# Patient Record
Sex: Female | Born: 1945 | Race: White | Hispanic: No | Smoking: Never smoker
Health system: Southern US, Community
[De-identification: ages and names within clinical notes are randomized; demographics above are authoritative.]

## PROBLEM LIST (undated history)

## (undated) DIAGNOSIS — E7800 Pure hypercholesterolemia, unspecified: Secondary | ICD-10-CM

## (undated) HISTORY — PX: ABDOMINAL HYSTERECTOMY: SHX81

---

## 2004-04-12 ENCOUNTER — Ambulatory Visit: Payer: Self-pay | Admitting: Internal Medicine

## 2005-06-22 ENCOUNTER — Ambulatory Visit: Payer: Self-pay | Admitting: Internal Medicine

## 2006-08-08 ENCOUNTER — Ambulatory Visit: Payer: Self-pay | Admitting: Internal Medicine

## 2007-09-05 ENCOUNTER — Ambulatory Visit: Payer: Self-pay | Admitting: Internal Medicine

## 2008-10-08 ENCOUNTER — Ambulatory Visit: Payer: Self-pay | Admitting: Internal Medicine

## 2009-10-11 ENCOUNTER — Ambulatory Visit: Payer: Self-pay | Admitting: Internal Medicine

## 2009-10-15 ENCOUNTER — Ambulatory Visit: Payer: Self-pay | Admitting: Gastroenterology

## 2010-10-14 ENCOUNTER — Ambulatory Visit: Payer: Self-pay | Admitting: Internal Medicine

## 2011-11-13 ENCOUNTER — Ambulatory Visit: Payer: Self-pay | Admitting: Internal Medicine

## 2012-11-13 ENCOUNTER — Ambulatory Visit: Payer: Self-pay | Admitting: Internal Medicine

## 2012-11-19 ENCOUNTER — Ambulatory Visit: Payer: Self-pay | Admitting: Internal Medicine

## 2013-07-08 ENCOUNTER — Ambulatory Visit: Payer: Self-pay | Admitting: Family Medicine

## 2014-01-01 ENCOUNTER — Ambulatory Visit: Payer: Self-pay | Admitting: Family Medicine

## 2015-02-26 ENCOUNTER — Other Ambulatory Visit: Payer: Self-pay | Admitting: Family Medicine

## 2015-02-26 DIAGNOSIS — Z1231 Encounter for screening mammogram for malignant neoplasm of breast: Secondary | ICD-10-CM

## 2015-03-03 ENCOUNTER — Ambulatory Visit
Admission: RE | Admit: 2015-03-03 | Discharge: 2015-03-03 | Disposition: A | Discharge status: Home / Self Care | Payer: Medicare Other | Source: Ambulatory Visit | Attending: Family Medicine | Admitting: Family Medicine

## 2015-03-03 ENCOUNTER — Other Ambulatory Visit: Payer: Self-pay | Admitting: Family Medicine

## 2015-03-03 DIAGNOSIS — Z1231 Encounter for screening mammogram for malignant neoplasm of breast: Secondary | ICD-10-CM

## 2016-03-07 ENCOUNTER — Other Ambulatory Visit: Payer: Self-pay | Admitting: Family Medicine

## 2016-03-07 DIAGNOSIS — Z1231 Encounter for screening mammogram for malignant neoplasm of breast: Secondary | ICD-10-CM

## 2016-04-11 ENCOUNTER — Ambulatory Visit
Admission: RE | Admit: 2016-04-11 | Discharge: 2016-04-11 | Disposition: A | Discharge status: Home / Self Care | Payer: Medicare Other | Source: Ambulatory Visit | Attending: Family Medicine | Admitting: Family Medicine

## 2016-04-11 DIAGNOSIS — Z1231 Encounter for screening mammogram for malignant neoplasm of breast: Secondary | ICD-10-CM | POA: Diagnosis not present

## 2016-09-12 ENCOUNTER — Emergency Department
Admission: EM | Admit: 2016-09-12 | Discharge: 2016-09-12 | Disposition: A | Discharge status: Home / Self Care | Payer: Medicare Other | Attending: Emergency Medicine | Admitting: Emergency Medicine

## 2016-09-12 ENCOUNTER — Encounter: Payer: Self-pay | Admitting: Emergency Medicine

## 2016-09-12 ENCOUNTER — Emergency Department: Payer: Medicare Other

## 2016-09-12 DIAGNOSIS — Y999 Unspecified external cause status: Secondary | ICD-10-CM | POA: Insufficient documentation

## 2016-09-12 DIAGNOSIS — Y939 Activity, unspecified: Secondary | ICD-10-CM | POA: Diagnosis not present

## 2016-09-12 DIAGNOSIS — Y92009 Unspecified place in unspecified non-institutional (private) residence as the place of occurrence of the external cause: Secondary | ICD-10-CM | POA: Diagnosis not present

## 2016-09-12 DIAGNOSIS — S46002A Unspecified injury of muscle(s) and tendon(s) of the rotator cuff of left shoulder, initial encounter: Secondary | ICD-10-CM | POA: Diagnosis not present

## 2016-09-12 DIAGNOSIS — W19XXXA Unspecified fall, initial encounter: Secondary | ICD-10-CM

## 2016-09-12 DIAGNOSIS — Z79899 Other long term (current) drug therapy: Secondary | ICD-10-CM | POA: Insufficient documentation

## 2016-09-12 DIAGNOSIS — W1839XA Other fall on same level, initial encounter: Secondary | ICD-10-CM | POA: Insufficient documentation

## 2016-09-12 DIAGNOSIS — M545 Low back pain, unspecified: Secondary | ICD-10-CM

## 2016-09-12 DIAGNOSIS — Z7982 Long term (current) use of aspirin: Secondary | ICD-10-CM | POA: Insufficient documentation

## 2016-09-12 DIAGNOSIS — S22000A Wedge compression fracture of unspecified thoracic vertebra, initial encounter for closed fracture: Secondary | ICD-10-CM

## 2016-09-12 DIAGNOSIS — S22080A Wedge compression fracture of T11-T12 vertebra, initial encounter for closed fracture: Secondary | ICD-10-CM | POA: Diagnosis not present

## 2016-09-12 DIAGNOSIS — S4992XA Unspecified injury of left shoulder and upper arm, initial encounter: Secondary | ICD-10-CM | POA: Diagnosis present

## 2016-09-12 HISTORY — DX: Pure hypercholesterolemia, unspecified: E78.00

## 2016-09-12 NOTE — ED Provider Notes (Signed)
[facility] Emergency Department Provider Note ____________________________________________  Time seen: 1056  I have reviewed the triage vital signs and the nursing notes.  HISTORY  Chief Complaint  Fall  HPI Victoria Zamora is a 71 y.o. female presents to the ED, accompanied by her granddaughter, for evaluation of injuries sustained following a fall today. She describes being at home trying to step over something in her doorway, when she lost her balance. She describes falling backwards landing on her back and her left arm. She denies any head injury, cuts, scrapes, or abrasions. She describes pain at 5 out of 10 to the back and 2 out of 10 to the left arm in triage. Her primary complaint at this point is disability with ROM to the left arm. She notes overall, that her pain, at rest, is actually resolved. She does report some decreased range of motion to her left shoulder. She denies any other injury at this time. She took 500 mg of Tylenol prior to arrival.  Past Medical History:  Diagnosis Date  . High cholesterol     There are no active problems to display for this patient.   History reviewed. No pertinent surgical history.  Prior to Admission medications   Medication Sig Start Date End Date Taking? Authorizing Provider  aspirin 81 MG chewable tablet Chew 81 mg by mouth daily.   Yes [provider]  rosuvastatin (CRESTOR) 20 MG tablet Take 20 mg by mouth daily.   Yes [provider]    Allergies Patient has no known allergies.  Family History  Problem Relation Age of Onset  . Breast cancer Mother [id number]  . Breast cancer Paternal Aunt     Social History Social History  Substance Use Topics  . Smoking status: Never Smoker  . Smokeless tobacco: Never Used  . Alcohol use No    Review of Systems  Constitutional: Negative for fever. Eyes: Negative for visual changes. ENT: Negative for sore throat. Cardiovascular: Negative for  chest pain. Respiratory: Negative for shortness of breath. Musculoskeletal: Positive for back & LUE pain. Skin: Negative for rash. Neurological: Negative for headaches, focal weakness or numbness. ____________________________________________  PHYSICAL EXAM:  VITAL SIGNS: ED Triage Vitals  Enc Vitals Group     BP 09/12/16 1053 (!) 167/84     Pulse Rate 09/12/16 1053 89     Resp 09/12/16 1053 18     Temp 09/12/16 1053 98.2 F (36.8 C)     Temp Source 09/12/16 1053 Oral     SpO2 09/12/16 1053 95 %     Weight 09/12/16 1024 165 lb (74.8 kg)     Height 09/12/16 1024 5' (1.524 m)     Head Circumference --      Peak Flow --      Pain Score 09/12/16 1024 5     Pain Loc --      Pain Edu? --      Excl. in GC? --     Constitutional: Alert and oriented. Well appearing and in no distress. Head: Normocephalic and atraumatic. Eyes: Conjunctivae are normal. PERRL. Normal extraocular movements Ears: Canals clear. TMs intact bilaterally. Neck: Supple. No thyromegaly. Cardiovascular: Normal rate, regular rhythm. Normal distal pulses. Respiratory: Normal respiratory effort. No wheezes/rales/rhonchi. Gastrointestinal: Soft and nontender. No distention. Musculoskeletal: Normal spinal alignment without midline tenderness, spasm, deformity, or step-off. Patient is tender to palpation over the bilateral lumbar sacral paraspinal muscles and the sacrum. Left Upper extremities without any obvious deformity, dislocation, or  sulcus sign. Patient with decreased active range of motion to the left shoulder with extension and abduction. She is able to demonstrate a normal composite fist bilaterally. Passively I'm able to range her fully to the left upper extremity and she has a negative drop arm test. She has normal bicep muscle resistance testing and a negative Yergason's. Resistance testing to her infraspinatus and supraspinatus tendons 4/5 and intact. Nontender with normal range of motion in all other  extremities.  Neurologic: Cranial nerves II through XII grossly intact. Normal UE/LE DTRs bilaterally. Normal gait without ataxia. Normal speech and language. No gross focal neurologic deficits are appreciated. Skin:  Skin is warm, dry and intact. No rash noted. ____________________________________________   RADIOLOGY  Lumbar Spine IMPRESSION: Probable acute slight compression fracture of the inferior endplate of T11. No visible protrusion of bone into the spinal canal. No acute abnormality of the lumbar spine.  Left Shoulder  IMPRESSION: Negative.  I, Loc Feinstein, Charlesetta Ivory, personally viewed and evaluated these images (plain radiographs) as part of my medical decision making, as well as reviewing the written report by the radiologist. ____________________________________________  PROCEDURES  Arm sling ____________________________________________  INITIAL IMPRESSION / ASSESSMENT AND PLAN / ED COURSE  Geriatric patient with a mechanical ground-level fall presents to the ED for evaluation of injuries. She is found to have negative lumbar and shoulder x-rays on exam. She otherwise has a noticeable left rotator cuff deficit without evidence of frank, complete tear. She is fitted with an arm sling and given follow-up instructions. She will see her provider or Dr. Deeann Saint for further management.  ____________________________________________  FINAL CLINICAL IMPRESSION(S) / ED DIAGNOSES  Final diagnoses:  Fall in home, initial encounter  Acute midline low back pain without sciatica  Thoracic compression fracture, closed, initial encounter [facility])  Rotator cuff injury, left, initial encounter      Lissa Hoard, PA-C 09/12/16 1609    Charlynne Pander, MD 09/14/16 [phone]

## 2016-09-12 NOTE — ED Notes (Signed)
See triage note  states she tried to step over something and lost her balance  Larey SeatFell backwards  Having pain to left arm and lower back

## 2016-09-12 NOTE — Discharge Instructions (Signed)
Your exam and x-rays are essentially normal following your fall. 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 You also have a stable, minor compression deformity (fracture) to the upper back vertebrae (#11). You have strained your rotator cuff (ligaments) on the left shoulder. This will cause some weakness and decreased range of motion. Wear the sling when out of bed for support. Take OTC Tylenol and Aleve as needed. Follow-up witd Dr. Larwance SachsBabaoff  or Dr. Hyacinth MeekerMiller for continued treatment.

## 2016-09-12 NOTE — ED Triage Notes (Signed)
Pt to ed with c/o fall today.  Pt states she was trying to step over something at home and lost her balance.  Pt reports she fell backwards landing on back and left arm. Pt reports pain in back is 5/10, pain in left arm is 2/10.  Pt reports decreased ROM in left shoulder.

## 2017-02-01 ENCOUNTER — Other Ambulatory Visit: Payer: Self-pay | Admitting: Family Medicine

## 2017-02-01 DIAGNOSIS — Z1231 Encounter for screening mammogram for malignant neoplasm of breast: Secondary | ICD-10-CM

## 2017-05-04 ENCOUNTER — Ambulatory Visit
Admission: RE | Admit: 2017-05-04 | Discharge: 2017-05-04 | Disposition: A | Discharge status: Home / Self Care | Payer: Medicare Other | Source: Ambulatory Visit | Attending: Family Medicine | Admitting: Family Medicine

## 2017-05-04 DIAGNOSIS — Z1231 Encounter for screening mammogram for malignant neoplasm of breast: Secondary | ICD-10-CM | POA: Diagnosis not present

## 2018-05-07 ENCOUNTER — Other Ambulatory Visit: Payer: Self-pay | Admitting: Family Medicine

## 2018-05-07 DIAGNOSIS — Z1231 Encounter for screening mammogram for malignant neoplasm of breast: Secondary | ICD-10-CM

## 2018-11-22 ENCOUNTER — Ambulatory Visit
Admission: RE | Admit: 2018-11-22 | Discharge: 2018-11-22 | Disposition: A | Discharge status: Home / Self Care | Payer: Medicare Other | Source: Ambulatory Visit | Attending: Family Medicine | Admitting: Family Medicine

## 2018-11-22 DIAGNOSIS — Z1231 Encounter for screening mammogram for malignant neoplasm of breast: Secondary | ICD-10-CM | POA: Diagnosis not present

## 2019-10-21 ENCOUNTER — Other Ambulatory Visit: Payer: Self-pay | Admitting: Family Medicine

## 2019-10-21 DIAGNOSIS — Z1231 Encounter for screening mammogram for malignant neoplasm of breast: Secondary | ICD-10-CM

## 2019-11-24 ENCOUNTER — Ambulatory Visit
Admission: RE | Admit: 2019-11-24 | Discharge: 2019-11-24 | Disposition: A | Discharge status: Home / Self Care | Payer: Medicare Other | Source: Ambulatory Visit | Attending: Family Medicine | Admitting: Family Medicine

## 2019-11-24 DIAGNOSIS — Z1231 Encounter for screening mammogram for malignant neoplasm of breast: Secondary | ICD-10-CM | POA: Diagnosis not present

## 2020-10-20 ENCOUNTER — Other Ambulatory Visit: Payer: Self-pay | Admitting: Family Medicine

## 2020-10-20 DIAGNOSIS — Z1231 Encounter for screening mammogram for malignant neoplasm of breast: Secondary | ICD-10-CM

## 2020-10-21 ENCOUNTER — Other Ambulatory Visit: Payer: Self-pay | Admitting: Family Medicine

## 2020-10-21 DIAGNOSIS — Z1231 Encounter for screening mammogram for malignant neoplasm of breast: Secondary | ICD-10-CM

## 2020-12-09 ENCOUNTER — Other Ambulatory Visit: Payer: Self-pay

## 2020-12-09 ENCOUNTER — Ambulatory Visit
Admission: RE | Admit: 2020-12-09 | Discharge: 2020-12-09 | Disposition: A | Discharge status: Home / Self Care | Payer: Medicare Other | Source: Ambulatory Visit | Attending: Family Medicine | Admitting: Family Medicine

## 2020-12-09 DIAGNOSIS — Z1231 Encounter for screening mammogram for malignant neoplasm of breast: Secondary | ICD-10-CM | POA: Diagnosis present

## 2020-12-18 LAB — COLOGUARD: COLOGUARD: NEGATIVE

## 2021-11-03 ENCOUNTER — Other Ambulatory Visit: Payer: Self-pay | Admitting: Family Medicine

## 2021-11-03 DIAGNOSIS — Z1231 Encounter for screening mammogram for malignant neoplasm of breast: Secondary | ICD-10-CM

## 2021-12-14 ENCOUNTER — Ambulatory Visit
Admission: RE | Admit: 2021-12-14 | Discharge: 2021-12-14 | Disposition: A | Discharge status: Home / Self Care | Payer: Medicare Other | Source: Ambulatory Visit | Attending: Family Medicine | Admitting: Family Medicine

## 2021-12-14 DIAGNOSIS — Z1231 Encounter for screening mammogram for malignant neoplasm of breast: Secondary | ICD-10-CM | POA: Insufficient documentation

## 2022-10-19 ENCOUNTER — Other Ambulatory Visit: Payer: Self-pay | Admitting: Family Medicine

## 2022-10-19 DIAGNOSIS — Z1231 Encounter for screening mammogram for malignant neoplasm of breast: Secondary | ICD-10-CM

## 2022-12-18 IMAGING — MG MM DIGITAL SCREENING BILAT W/ TOMO AND CAD
6 of 10 series · 6 of 30 positions shown · non-contrast
Comparison: Previous exam(s).

CLINICAL DATA: Screening.

EXAM:
DIGITAL SCREENING BILATERAL MAMMOGRAM WITH TOMOSYNTHESIS AND CAD
TECHNIQUE: Bilateral screening digital craniocaudal and mediolateral oblique
mammograms were obtained. Bilateral screening digital breast
tomosynthesis was performed. The images were evaluated with
computer-aided detection.

[R MLO synth-2D]
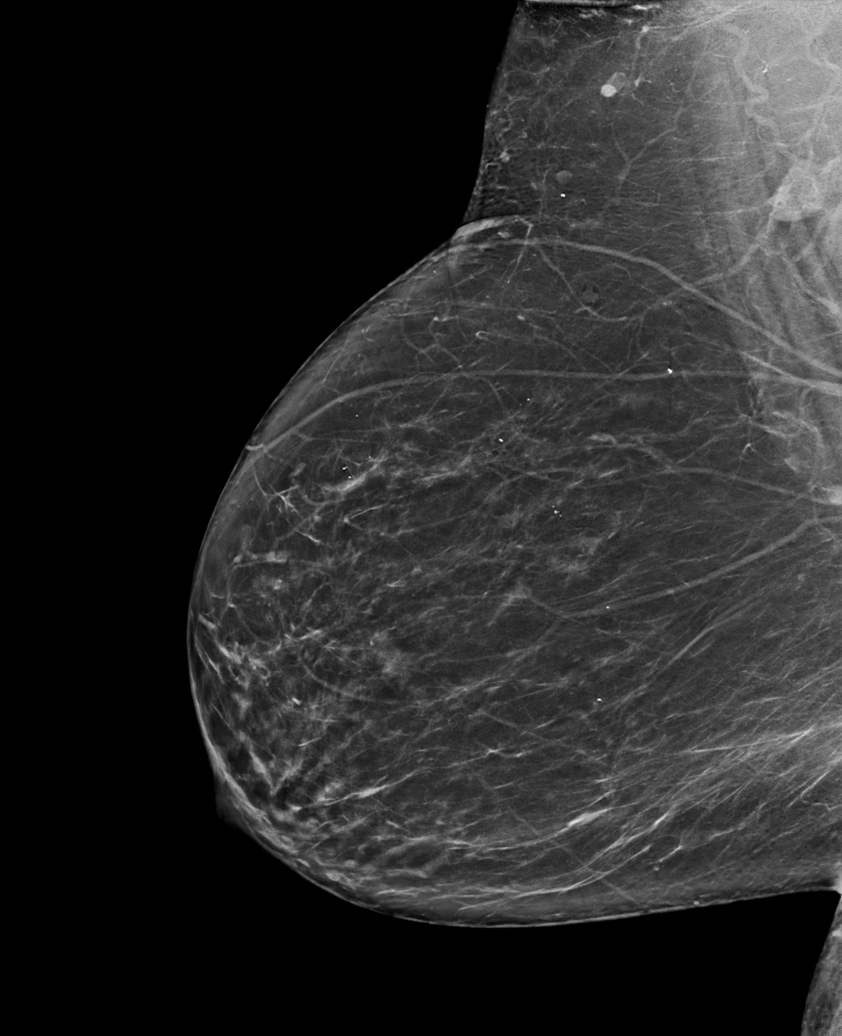

[R XCCL synth-2D]
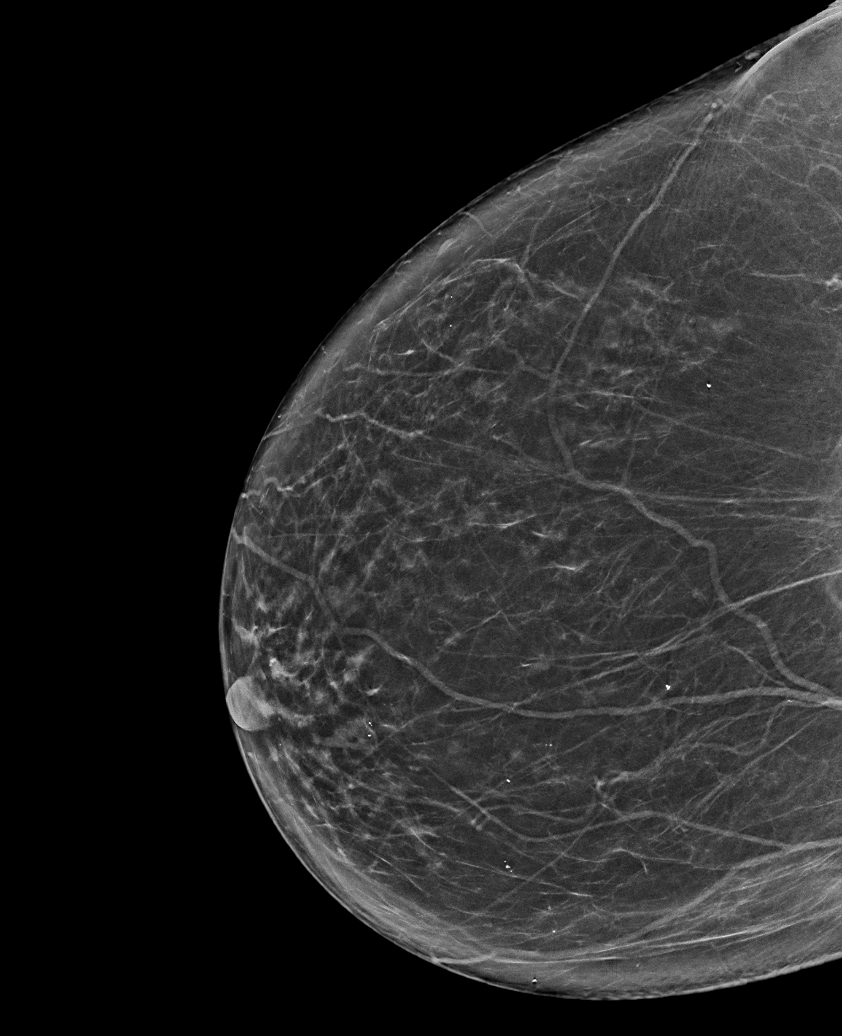

[L CC synth-2D]
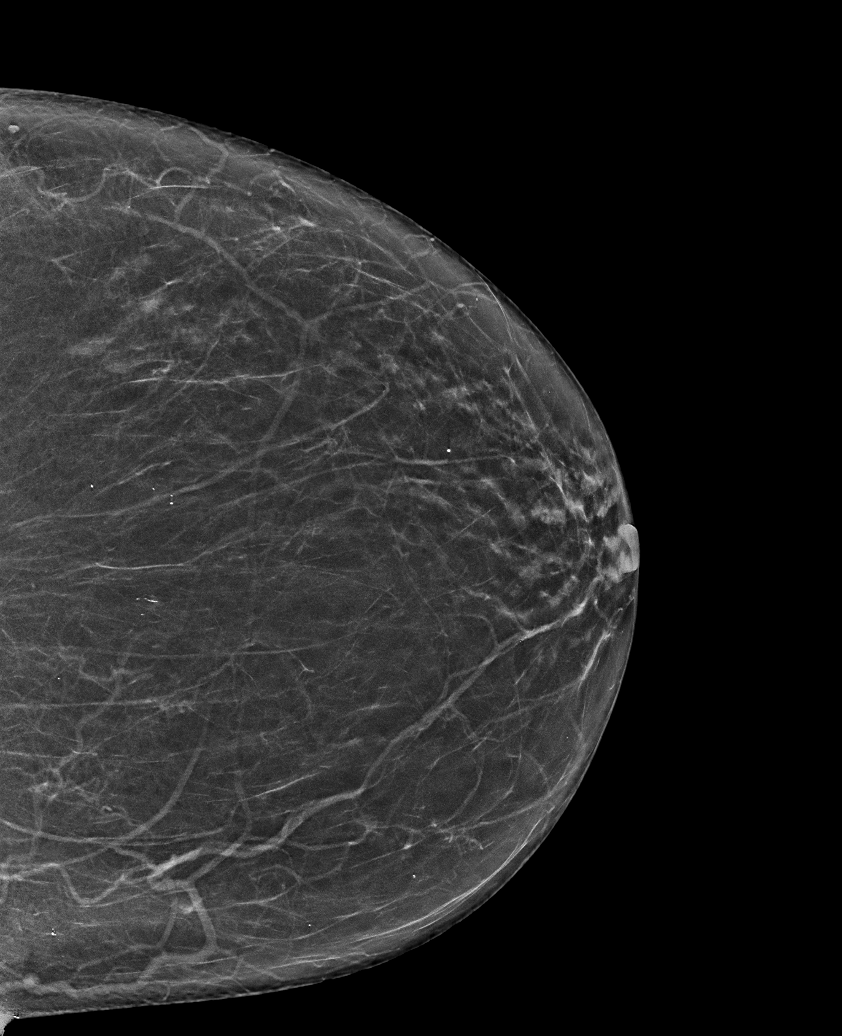

[L MLO synth-2D]
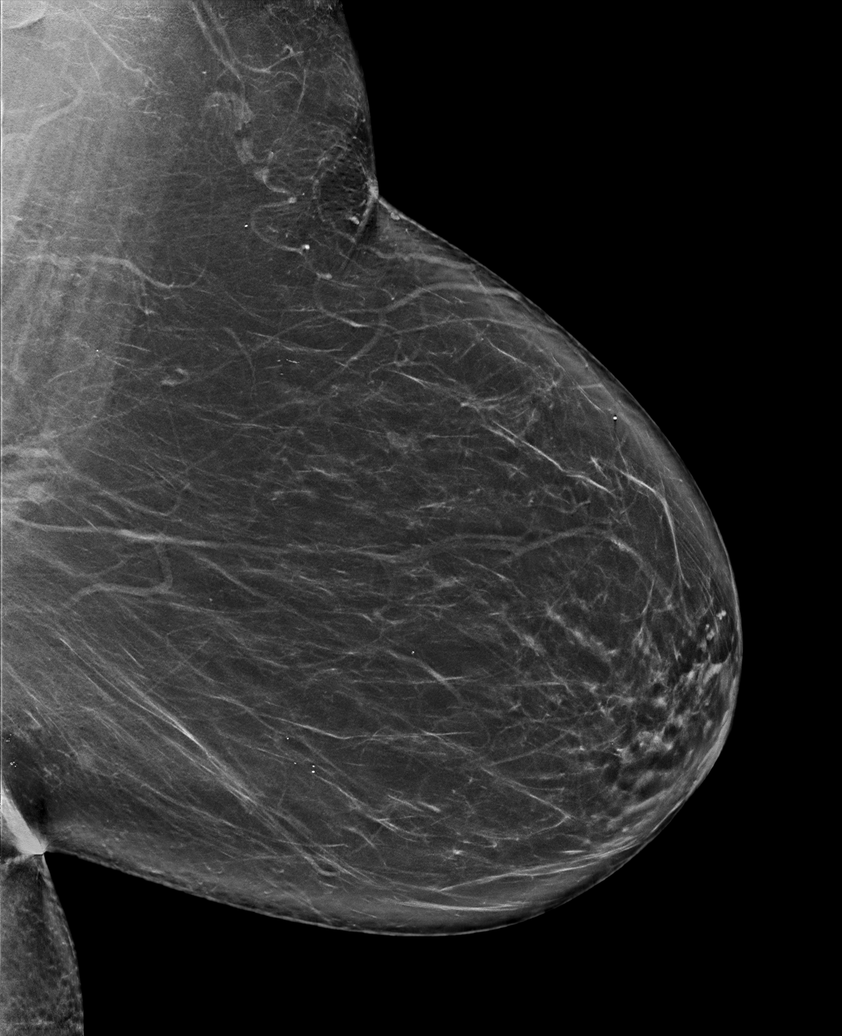

[R CC synth-2D]
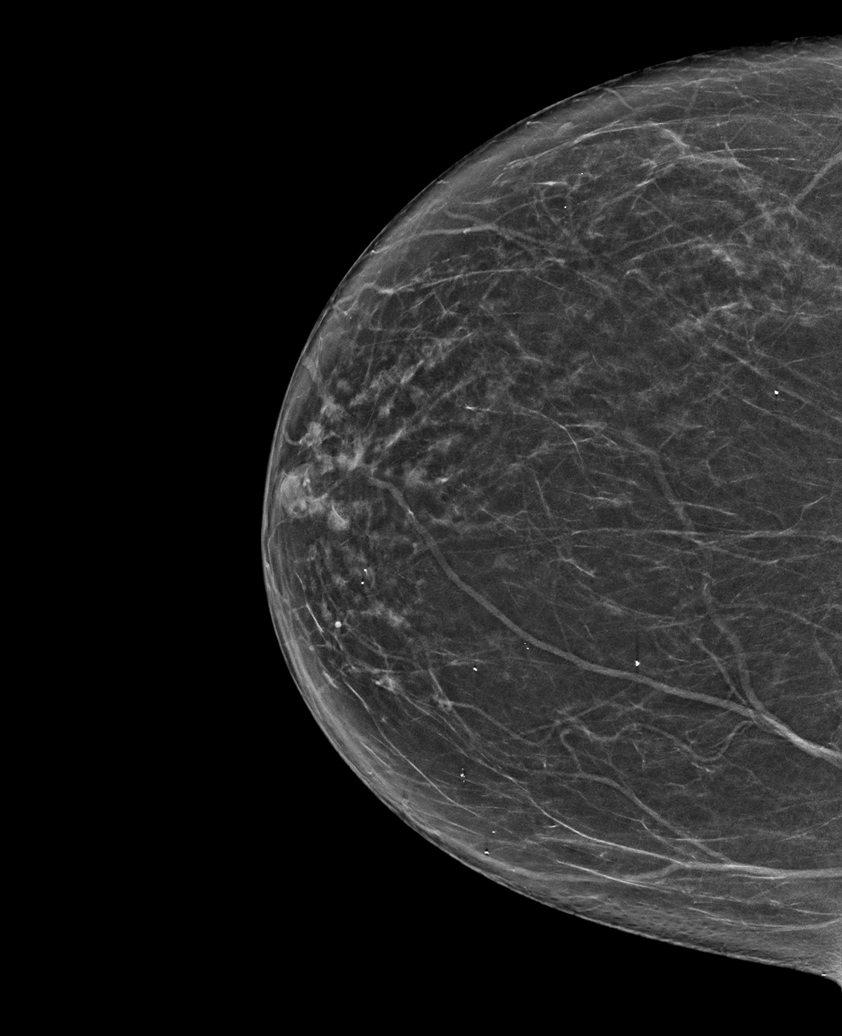

[R MLO tomo · tomo slice 41/80.0]
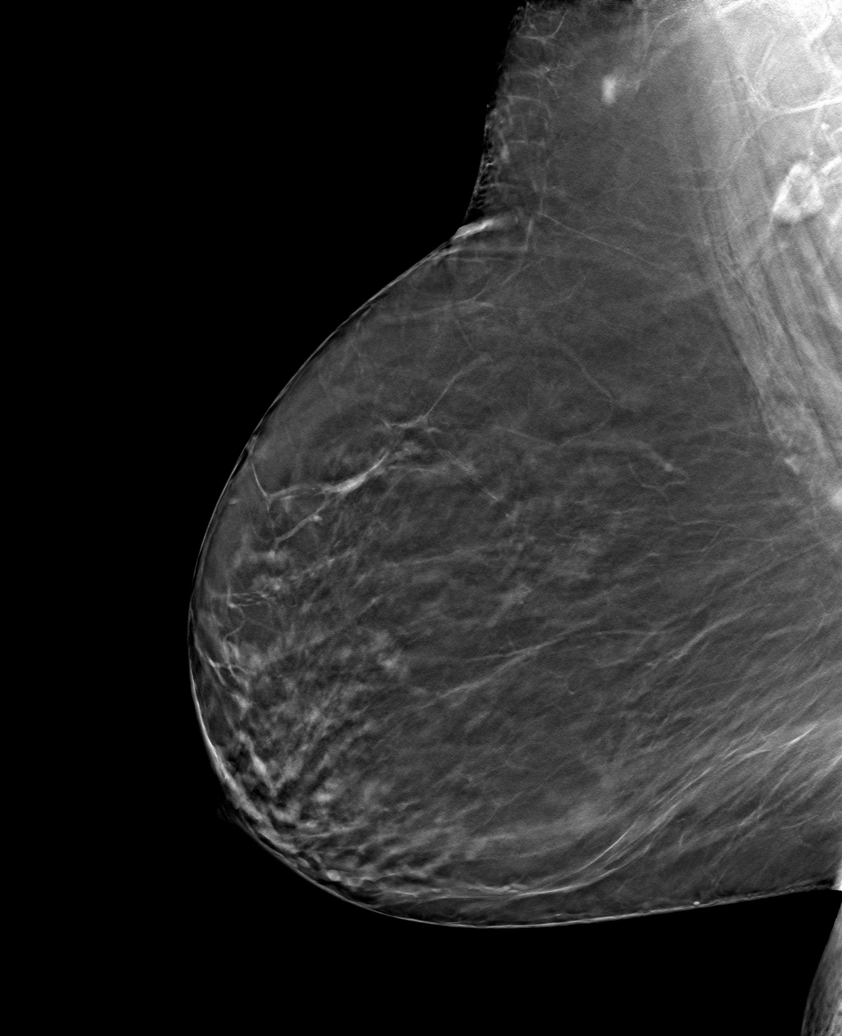

[6 of 30 positions shown; findings below may reference images not displayed]

ACR Breast Density Category b: There are scattered areas of
fibroglandular density.
FINDINGS: There are no findings suspicious for malignancy.
IMPRESSION: No mammographic evidence of malignancy. A result letter of this
screening mammogram will be mailed directly to the patient.

RECOMMENDATION:
Screening mammogram in one year. (Code:51-O-LD2)

BI-RADS CATEGORY  1: Negative.

## 2022-12-19 ENCOUNTER — Ambulatory Visit
Admission: RE | Admit: 2022-12-19 | Discharge: 2022-12-19 | Disposition: A | Discharge status: Home / Self Care | Payer: Medicare Other | Source: Ambulatory Visit | Attending: Family Medicine | Admitting: Family Medicine

## 2022-12-19 DIAGNOSIS — Z1231 Encounter for screening mammogram for malignant neoplasm of breast: Secondary | ICD-10-CM | POA: Insufficient documentation

## 2023-11-05 ENCOUNTER — Other Ambulatory Visit: Payer: Self-pay | Admitting: Family Medicine

## 2023-11-05 DIAGNOSIS — Z1231 Encounter for screening mammogram for malignant neoplasm of breast: Secondary | ICD-10-CM

## 2023-12-26 ENCOUNTER — Ambulatory Visit
Admission: RE | Admit: 2023-12-26 | Discharge: 2023-12-26 | Disposition: A | Discharge status: Home / Self Care | Source: Ambulatory Visit | Attending: Family Medicine | Admitting: Family Medicine

## 2023-12-26 DIAGNOSIS — Z1231 Encounter for screening mammogram for malignant neoplasm of breast: Secondary | ICD-10-CM | POA: Insufficient documentation
